# Patient Record
Sex: Male | Born: 1975 | Race: Black or African American | Hispanic: No | Marital: Single | Smoking: Current every day smoker
Health system: Southern US, Community
[De-identification: ages and names within clinical notes are randomized; demographics above are authoritative.]

---

## 1998-05-08 ENCOUNTER — Emergency Department (HOSPITAL_COMMUNITY): Admission: EM | Admit: 1998-05-08 | Discharge: 1998-05-08 | Payer: Self-pay | Admitting: Emergency Medicine

## 1998-05-08 ENCOUNTER — Encounter: Payer: Self-pay | Admitting: Emergency Medicine

## 2003-08-14 ENCOUNTER — Emergency Department (HOSPITAL_COMMUNITY): Admission: EM | Admit: 2003-08-14 | Discharge: 2003-08-14 | Payer: Self-pay | Admitting: Emergency Medicine

## 2013-03-10 ENCOUNTER — Emergency Department (HOSPITAL_COMMUNITY): Payer: Self-pay

## 2013-03-10 ENCOUNTER — Encounter (HOSPITAL_COMMUNITY): Payer: Self-pay | Admitting: *Deleted

## 2013-03-10 ENCOUNTER — Emergency Department (HOSPITAL_COMMUNITY)
Admission: EM | Admit: 2013-03-10 | Discharge: 2013-03-10 | Disposition: A | Discharge status: Home / Self Care | Payer: Self-pay | Attending: Emergency Medicine | Admitting: Emergency Medicine

## 2013-03-10 DIAGNOSIS — Z4889 Encounter for other specified surgical aftercare: Secondary | ICD-10-CM | POA: Insufficient documentation

## 2013-03-10 DIAGNOSIS — S91109A Unspecified open wound of unspecified toe(s) without damage to nail, initial encounter: Secondary | ICD-10-CM | POA: Insufficient documentation

## 2013-03-10 DIAGNOSIS — Y939 Activity, unspecified: Secondary | ICD-10-CM | POA: Insufficient documentation

## 2013-03-10 DIAGNOSIS — Y929 Unspecified place or not applicable: Secondary | ICD-10-CM | POA: Insufficient documentation

## 2013-03-10 DIAGNOSIS — X58XXXA Exposure to other specified factors, initial encounter: Secondary | ICD-10-CM | POA: Insufficient documentation

## 2013-03-10 DIAGNOSIS — T148XXD Other injury of unspecified body region, subsequent encounter: Secondary | ICD-10-CM

## 2013-03-10 LAB — GLUCOSE, CAPILLARY: Glucose-Capillary: 95 mg/dL (ref 70–99)

## 2013-03-10 MED ORDER — HYDROCODONE-ACETAMINOPHEN 5-325 MG PO TABS
2.0000 | ORAL_TABLET | Freq: Once | ORAL | Status: AC
  Administered 2013-03-10: 2 via ORAL
  Filled 2013-03-10: qty 2

## 2013-03-10 MED ORDER — HYDROCODONE-ACETAMINOPHEN 5-325 MG PO TABS: 1.0000 | ORAL_TABLET | Freq: Four times a day (QID) | ORAL | Status: DC | PRN

## 2013-03-10 NOTE — ED Notes (Addendum)
Pt reports ongoing right toe pain, states [place] had the toe lanced about 1.5 months ago and has had difficulty with toe since then. Pain 8/10. Pt ambulatory.

## 2013-03-10 NOTE — ED Provider Notes (Signed)
History    CSN: [id number] Arrival date & time 03/10/13  7425  First MD Initiated Contact with Patient 03/10/13 1000     Chief Complaint  Patient presents with  . Toe Pain   (Consider location/radiation/quality/duration/timing/severity/associated sxs/prior Treatment) HPI Comments: Patient presents today with a wound of the dorsal aspect of the right 5th toe.  He reports that he had an abscess incised and drained in that area approximately 6 weeks ago.  The wound has been present since that time.  He states that he has not noticed any changes in the wound.  No drainage from the wound.  No surrounding erythema or edema.  He is able to wiggle his toes.  Denies numbness or tingling.  He states that the wound is painful.  He has not tried any treatment prior to arrival.  He denies history of DM or HIV.  He states that he is otherwise healthy.  The history is provided by the patient.   History reviewed. No pertinent past medical history. No past surgical history on file. No family history on file. History  Substance Use Topics  . Smoking status: Not on file  . Smokeless tobacco: Not on file  . Alcohol Use: Not on file    Review of Systems  Skin: Positive for wound.    Allergies  Review of patient's allergies indicates no known allergies.  Home Medications   Current Outpatient Rx  Name  Route  Sig  Dispense  Refill  . ibuprofen (ADVIL,MOTRIN) 200 MG tablet   Oral   Take 400 mg by mouth every 6 (six) hours as needed for pain.          BP 162/88  Pulse 84  Temp(Src) 97.8 F (36.6 C) (Oral)  Resp 18  SpO2 99% Physical Exam  Nursing note and vitals reviewed. Constitutional: He appears well-developed and well-nourished. No distress.  Cardiovascular: Normal rate, regular rhythm, normal heart sounds and intact distal pulses.   Pulses:      Dorsalis pedis pulses are 2+ on the right side, and 2+ on the left side.  Pulmonary/Chest: Effort normal and breath sounds normal.   Musculoskeletal: Normal range of motion.  Patient able to wiggle all toes of the right foot.  Neurological: He is alert. Gait normal.  Sensation of all toes intact  Skin: Skin is warm and dry. He is not diaphoretic.  1 cm circular elevated wound on the dorsal aspect of the right 5th toe.  No drainage of the wound Good capillary refill of the right 5th toe No erythema, edema, or warmth surrounding the wound.  Psychiatric: He has a normal mood and affect.    ED Course  Procedures (including critical care time) Labs Reviewed  GLUCOSE, CAPILLARY   Dg Foot Complete Right  03/10/2013   *RADIOLOGY REPORT*  Clinical Data: 37 year old male with history of right foot infection 1 month ago.  Persistent fifth toe pain.  RIGHT FOOT COMPLETE - 3+ VIEW  Comparison: None.  Findings: Mild pes planus.  Calcaneus intact.  Joint spaces and alignment preserved. Bone mineralization is within normal limits. No osteolysis identified.  No subcutaneous gas.  No acute fracture or dislocation identified.  IMPRESSION: No acute osseous abnormality identified about the right foot.   Original Report Authenticated By: Erskine Speed, M.D.   No diagnosis found.  MDM  Patient presenting with wound to his right 5th toe that has been present for the past 6 weeks.  He states that the wound is  unchanged.  Xray negative.  No signs of infection at this time.  Patient denies history of DM and has a normal CBG in the ED.  Feel that patient is stable for discharge.  Patient given referral to Podiatry.  Pascal Lux [place], PA-C 03/11/13 1235

## 2013-03-10 NOTE — Progress Notes (Signed)
[facility] has seen patient and provided him with a list of primary care resources. °

## 2013-03-11 NOTE — ED Provider Notes (Signed)
Medical screening examination/treatment/procedure(s) were performed by non-physician practitioner and as supervising physician I was immediately available for consultation/collaboration.    Hanifa Antonetti R Marria Mathison, MD 03/11/13 1554 

## 2013-04-02 ENCOUNTER — Encounter (HOSPITAL_BASED_OUTPATIENT_CLINIC_OR_DEPARTMENT_OTHER): Payer: Self-pay | Attending: General Surgery

## 2013-04-02 DIAGNOSIS — I10 Essential (primary) hypertension: Secondary | ICD-10-CM | POA: Insufficient documentation

## 2013-04-02 DIAGNOSIS — L84 Corns and callosities: Secondary | ICD-10-CM | POA: Insufficient documentation

## 2013-04-03 NOTE — Progress Notes (Signed)
[facility]  NAME:  EDITH, LORD              ACCOUNT NO.:  [id number]  MEDICAL RECORD NO.:  [id number]      DATE OF BIRTH:  August 29, 1976  PHYSICIAN:  Ardath Sax, M.D.           VISIT DATE:                                  OFFICE VISIT   This is a previously healthy 37 year old African American male who comes to [place] with mirror images of each other of what looks to be callus on the lateral fifth toe, both of his feet.  I think it is just from poor fitting shoes and I advised him to use Dr. Margart Sickles foot pads and I even said to go to [employer] to have shoes made properly.  He is not a diabetic.  He takes no medicine, although he was hypertensive.  His blood pressure was 175/100, pulse 67, temperature 98.  He has no true ulcers or just callus formation or what almost looks like corns.  So, I advised him to get pressure off of these, get some proper fitting shoes and use some Dr. Margart Sickles corn pads and other diagnosis, I have told him he was hypertensive and to see his doctor.     Ardath Sax, M.D.     PP/MEDQ  D:  04/02/2013  T:  04/03/2013  Job:  [id number]

## 2014-06-29 ENCOUNTER — Emergency Department (HOSPITAL_COMMUNITY)
Admission: EM | Admit: 2014-06-29 | Discharge: 2014-06-29 | Disposition: A | Discharge status: Home / Self Care | Payer: Self-pay | Attending: Emergency Medicine | Admitting: Emergency Medicine

## 2014-06-29 ENCOUNTER — Emergency Department (HOSPITAL_COMMUNITY): Payer: Self-pay

## 2014-06-29 ENCOUNTER — Encounter (HOSPITAL_COMMUNITY): Payer: Self-pay | Admitting: Emergency Medicine

## 2014-06-29 DIAGNOSIS — Y9389 Activity, other specified: Secondary | ICD-10-CM | POA: Insufficient documentation

## 2014-06-29 DIAGNOSIS — S62317A Displaced fracture of base of fifth metacarpal bone. left hand, initial encounter for closed fracture: Secondary | ICD-10-CM | POA: Insufficient documentation

## 2014-06-29 DIAGNOSIS — IMO0001 Reserved for inherently not codable concepts without codable children: Secondary | ICD-10-CM

## 2014-06-29 DIAGNOSIS — R030 Elevated blood-pressure reading, without diagnosis of hypertension: Secondary | ICD-10-CM | POA: Insufficient documentation

## 2014-06-29 DIAGNOSIS — W228XXA Striking against or struck by other objects, initial encounter: Secondary | ICD-10-CM | POA: Insufficient documentation

## 2014-06-29 DIAGNOSIS — S62308A Unspecified fracture of other metacarpal bone, initial encounter for closed fracture: Secondary | ICD-10-CM

## 2014-06-29 DIAGNOSIS — Z720 Tobacco use: Secondary | ICD-10-CM | POA: Insufficient documentation

## 2014-06-29 DIAGNOSIS — Y9289 Other specified places as the place of occurrence of the external cause: Secondary | ICD-10-CM | POA: Insufficient documentation

## 2014-06-29 MED ORDER — HYDROCODONE-ACETAMINOPHEN 5-325 MG PO TABS: 1.0000 | ORAL_TABLET | ORAL | Status: DC | PRN

## 2014-06-29 MED ORDER — OXYCODONE-ACETAMINOPHEN 5-325 MG PO TABS
1.0000 | ORAL_TABLET | Freq: Once | ORAL | Status: AC
  Administered 2014-06-29: 1 via ORAL
  Filled 2014-06-29: qty 1

## 2014-06-29 MED ORDER — NAPROXEN 500 MG PO TABS: 500.0000 mg | ORAL_TABLET | Freq: Two times a day (BID) | ORAL | Status: AC

## 2014-06-29 NOTE — Discharge Instructions (Signed)
Naprosyn for pain. Norco for severe pain. Follow up with orthopedics specialist. Return if any issues. Watch your diet to help with blood pressure. Follow up with [facility] for recheck and further treatment.    Boxer's Fracture You have a break (fracture) of the fifth metacarpal bone. This is commonly called a boxer's fracture. This is the bone in the hand where the little finger attaches. The fracture is in the end of that bone, closest to the little finger. It is usually caused when you hit an object with a clenched fist. Often, the knuckle is pushed down by the impact. Sometimes, the fracture rotates out of position. A boxer's fracture will usually heal within 6 weeks, if it is treated properly and protected from re-injury. Surgery is sometimes needed. A cast, splint, or bulky hand dressing may be used to protect and immobilize a boxer's fracture. Do not remove this device or dressing until your caregiver approves. Keep your hand elevated, and apply ice packs for 15-20 minutes every 2 hours, for the first 2 days. Elevation and ice help reduce swelling and relieve pain. See your caregiver, or an orthopedic specialist, for follow-up care within the next 10 days. This is to make sure your fracture is healing properly. Document Released: 08/28/2005 Document Revised: 11/20/2011 Document Reviewed: 02/15/2007 [facility] Patient Information 2015 [place], [place]. This information is not intended to replace advice given to you by your health care provider. Make sure you discuss any questions you have with your health care provider.   DASH Eating Plan DASH stands for "Dietary Approaches to Stop Hypertension." The DASH eating plan is a healthy eating plan that has been shown to reduce high blood pressure (hypertension). Additional health benefits may include reducing the risk of type 2 diabetes mellitus, heart disease, and stroke. The DASH eating plan may also help with weight loss. WHAT DO I NEED TO  KNOW ABOUT THE DASH EATING PLAN? For the DASH eating plan, you will follow these general guidelines:  Choose foods with a percent daily value for sodium of less than 5% (as listed on the food label).  Use salt-free seasonings or herbs instead of table salt or sea salt.  Check with your health care provider or pharmacist before using salt substitutes.  Eat lower-sodium products, often labeled as "lower sodium" or "no salt added."  Eat fresh foods.  Eat more vegetables, fruits, and low-fat dairy products.  Choose whole grains. Look for the word "whole" as the first word in the ingredient list.  Choose fish and skinless chicken or [place]key more often than red meat. Limit fish, poultry, and meat to 6 oz (170 g) each day.  Limit sweets, desserts, sugars, and sugary drinks.  Choose heart-healthy fats.  Limit cheese to 1 oz (28 g) per day.  Eat more home-cooked food and less restaurant, buffet, and fast food.  Limit fried foods.  Cook foods using methods other than frying.  Limit canned vegetables. If you do use them, rinse them well to decrease the sodium.  When eating at a restaurant, ask that your food be prepared with less salt, or no salt if possible. WHAT FOODS CAN I EAT? Seek help from a dietitian for individual calorie needs. Grains Whole grain or whole wheat bread. Brown rice. Whole grain or whole wheat pasta. Quinoa, bulgur, and whole grain cereals. Low-sodium cereals. Corn or whole wheat flour tortillas. Whole grain cornbread. Whole grain crackers. Low-sodium crackers. Vegetables Fresh or frozen vegetables (raw, steamed, roasted, or grilled). Low-sodium or  reduced-sodium tomato and vegetable juices. Low-sodium or reduced-sodium tomato sauce and paste. Low-sodium or reduced-sodium canned vegetables.  Fruits All fresh, canned (in natural juice), or frozen fruits. Meat and Other Protein Products Ground beef (85% or leaner), grass-fed beef, or beef trimmed of fat. Skinless  chicken or [place]key. Ground chicken or [place]key. Pork trimmed of fat. All fish and seafood. Eggs. Dried beans, peas, or lentils. Unsalted nuts and seeds. Unsalted canned beans. Dairy Low-fat dairy products, such as skim or 1% milk, 2% or reduced-fat cheeses, low-fat ricotta or cottage cheese, or plain low-fat yogurt. Low-sodium or reduced-sodium cheeses. Fats and Oils Tub margarines without trans fats. Light or reduced-fat mayonnaise and salad dressings (reduced sodium). Avocado. Safflower, olive, or canola oils. Natural peanut or almond butter. Other Unsalted popcorn and pretzels. The items listed above may not be a complete list of recommended foods or beverages. Contact your dietitian for more options. WHAT FOODS ARE NOT RECOMMENDED? Grains White bread. White pasta. White rice. Refined cornbread. Bagels and croissants. Crackers that contain trans fat. Vegetables Creamed or fried vegetables. Vegetables in a cheese sauce. Regular canned vegetables. Regular canned tomato sauce and paste. Regular tomato and vegetable juices. Fruits Dried fruits. Canned fruit in light or heavy syrup. Fruit juice. Meat and Other Protein Products Fatty cuts of meat. Ribs, chicken wings, bacon, sausage, bologna, salami, chitterlings, fatback, hot dogs, bratwurst, and packaged luncheon meats. Salted nuts and seeds. Canned beans with salt. Dairy Whole or 2% milk, cream, half-and-half, and cream cheese. Whole-fat or sweetened yogurt. Full-fat cheeses or blue cheese. Nondairy creamers and whipped toppings. Processed cheese, cheese spreads, or cheese curds. Condiments Onion and garlic salt, seasoned salt, table salt, and sea salt. Canned and packaged gravies. Worcestershire sauce. Tartar sauce. Barbecue sauce. Teriyaki sauce. Soy sauce, including reduced sodium. Steak sauce. Fish sauce. Oyster sauce. Cocktail sauce. Horseradish. Ketchup and mustard. Meat flavorings and tenderizers. Bouillon cubes. Hot sauce. Tabasco sauce.  Marinades. Taco seasonings. Relishes. Fats and Oils Butter, stick margarine, lard, shortening, ghee, and bacon fat. Coconut, palm kernel, or palm oils. Regular salad dressings. Other Pickles and olives. Salted popcorn and pretzels. The items listed above may not be a complete list of foods and beverages to avoid. Contact your dietitian for more information. WHERE CAN I FIND MORE INFORMATION? National Heart, Lung, and Blood Institute: CableProm[employer].itwww.nhlbi.nih.gov/health/health-topics/topics/dash/ Document Released: 08/17/2011 Document Revised: 01/12/2014 Document Reviewed: 07/02/2013 [facility]ExitCare Patient Information 2015 [place]ExitCare, [place]LLC. This information is not intended to replace advice given to you by your health care provider. Make sure you discuss any questions you have with your health care provider.

## 2014-06-29 NOTE — ED Notes (Signed)
Pt escorted to discharge window. Pt verbalized understanding discharge instructions. In no acute distress.  

## 2014-06-29 NOTE — ED Provider Notes (Signed)
CSN: [id number]     Arrival date & time 06/29/14  1050 History   First MD Initiated Contact with Patient 06/29/14 1102     Chief Complaint  Patient presents with  . Hand Injury     (Consider location/radiation/quality/duration/timing/severity/associated sxs/prior Treatment) HPI Hilbert OdorMarcus J Bussie is a 38 y.o. male who presents to emergency department complaining of left hand injury. Patient states he was punching I punching bag, and states he accidentally hit a metal part of the top. States this happened 3 days ago. He complaining of swelling and pain to the ulnar aspect of the hand. States unable to flex his fourth and fifth fingers. No other injuries. He has applied ice and taking ibuprofen with no relief. No prior hand injuries   History reviewed. No pertinent past medical history. History reviewed. No pertinent past surgical history. History reviewed. No pertinent family history. History  Substance Use Topics  . Smoking status: Current Every Day Smoker -- 1.50 packs/day for 25 years    Types: Cigarettes  . Smokeless tobacco: Not on file  . Alcohol Use: Yes    Review of Systems  Constitutional: Negative for fever and chills.  Respiratory: Negative for cough, chest tightness and shortness of breath.   Cardiovascular: Negative for chest pain, palpitations and leg swelling.  Musculoskeletal: Positive for arthralgias and joint swelling. Negative for myalgias, neck pain and neck stiffness.  Skin: Negative for rash.  Allergic/Immunologic: Negative for immunocompromised state.  Neurological: Negative for dizziness, weakness, light-headedness, numbness and headaches.  All other systems reviewed and are negative.     Allergies  Review of patient's allergies indicates no known allergies.  Home Medications   Prior to Admission medications   Medication Sig Start Date End Date Taking? Authorizing Provider  HYDROcodone-acetaminophen (NORCO/VICODIN) 5-325 MG per tablet Take 1-2 tablets  by mouth every 6 (six) hours as needed for pain. 03/10/13   Heather Laisure, PA-C  ibuprofen (ADVIL,MOTRIN) 200 MG tablet Take 400 mg by mouth every 6 (six) hours as needed for pain.    Historical Provider, MD   BP 178/126  Pulse 75  Temp(Src) 99 F (37.2 C) (Oral)  Resp 16  SpO2 98% Physical Exam  Nursing note and vitals reviewed. Constitutional: He appears well-developed and well-nourished. No distress.  HENT:  Head: Normocephalic and atraumatic.  Eyes: Conjunctivae are normal.  Neck: Neck supple.  Cardiovascular: Normal rate, regular rhythm and normal heart sounds.   Pulmonary/Chest: Effort normal. No respiratory distress. He has no wheezes. He has no rales.  Musculoskeletal:  Swelling noted to the left hand, specifically over fourth and fifth metacarpals. Tender to palpation. His wrist exam is normal with no tenderness, full range of motion. Patient unable to flex or extend fourth and fifth digits at MCP joints. Cap refill <2sec distally   Neurological: He is alert.  Skin: Skin is warm and dry.    ED Course  Procedures (including critical care time) Labs Review Labs Reviewed - No data to display  Imaging Review Dg Hand Complete Left  06/29/2014   CLINICAL DATA:  Initial encounter after punching injury with pain and swelling to the medial aspect of the left hand.  EXAM: LEFT HAND - COMPLETE 3+ VIEW  COMPARISON:  None.  FINDINGS: Three view exam of the left hand shows a comminuted fracture of the fifth metacarpal neck. No definite extension to the articular surface. Mild apex posterior angulation is evident. No other fracture involving the left hand. Overlying soft tissue swelling is evident.  IMPRESSION:  Comminuted fracture of the distal fifth metacarpal.   Electronically Signed   By: Kennith CenterEric  Mansell M.D.   On: 06/29/2014 11:39     EKG Interpretation None      MDM   Final diagnoses:  Closed fracture of 5th metacarpal, initial encounter  Elevated blood pressure     Patient's with left hand boxer's fracture, 3 days ago. Discussed with Dr. Melvyn Novasrtmann recommended splinting and followup in the office. Ulnar gutter splint applied by orthostatic. We'll discharge home with pain medications and followup. Patient's blood pressure was noted to be very high in emergency department. He is asymptomatic for his elevated blood pressure, specifically there is no headache, chest pain, shortness of breath, extremity swelling, dizziness, weakness. His blood pressure could also be slightly higher today due to pain. He does not have a primary care provider, and has not had a physical in multiple years. Will discharge home with outpatient followup, [facility] have his blood pressure rechecked in one week. Return precautions discussed.  Filed Vitals:   06/29/14 1055 06/29/14 1234  BP: 178/126 161/112  Pulse: 75 76  Temp: 99 F (37.2 C) 98.3 F (36.8 C)  TempSrc: Oral Oral  Resp: 16 16  SpO2: 98% 97%      Deeksha Cotrell A Meghen Akopyan, PA-C 06/29/14 1337

## 2014-06-29 NOTE — ED Notes (Addendum)
Pt reports on Friday night he was at a bar with a punching machine, pt punched but hit metal top part of the machine with left hand. Pt has noticeable swelling to left side of hand and pinkie finger area. Pain 7/10. Radial pulse present. Pt able to move all fingers, but has difficulty fully bending pinkie finger. Pt denies hx of HTN.

## 2014-06-29 NOTE — ED Provider Notes (Signed)
Medical screening examination/treatment/procedure(s) were performed by non-physician practitioner and as supervising physician I was immediately available for consultation/collaboration.   EKG Interpretation None       Ethelda ChickMartha K Linker, MD 06/29/14 1341

## 2014-07-30 ENCOUNTER — Emergency Department (HOSPITAL_COMMUNITY)
Admission: EM | Admit: 2014-07-30 | Discharge: 2014-07-30 | Disposition: A | Discharge status: Home / Self Care | Payer: Self-pay | Attending: Emergency Medicine | Admitting: Emergency Medicine

## 2014-07-30 ENCOUNTER — Emergency Department (HOSPITAL_COMMUNITY): Payer: Self-pay

## 2014-07-30 DIAGNOSIS — Y998 Other external cause status: Secondary | ICD-10-CM | POA: Insufficient documentation

## 2014-07-30 DIAGNOSIS — S62339A Displaced fracture of neck of unspecified metacarpal bone, initial encounter for closed fracture: Secondary | ICD-10-CM

## 2014-07-30 DIAGNOSIS — L03119 Cellulitis of unspecified part of limb: Secondary | ICD-10-CM

## 2014-07-30 DIAGNOSIS — Z791 Long term (current) use of non-steroidal anti-inflammatories (NSAID): Secondary | ICD-10-CM | POA: Insufficient documentation

## 2014-07-30 DIAGNOSIS — L03115 Cellulitis of right lower limb: Secondary | ICD-10-CM | POA: Insufficient documentation

## 2014-07-30 DIAGNOSIS — Y9389 Activity, other specified: Secondary | ICD-10-CM | POA: Insufficient documentation

## 2014-07-30 DIAGNOSIS — X58XXXA Exposure to other specified factors, initial encounter: Secondary | ICD-10-CM | POA: Insufficient documentation

## 2014-07-30 DIAGNOSIS — S62307A Unspecified fracture of fifth metacarpal bone, left hand, initial encounter for closed fracture: Secondary | ICD-10-CM

## 2014-07-30 DIAGNOSIS — B353 Tinea pedis: Secondary | ICD-10-CM | POA: Insufficient documentation

## 2014-07-30 DIAGNOSIS — S62336A Displaced fracture of neck of fifth metacarpal bone, right hand, initial encounter for closed fracture: Secondary | ICD-10-CM | POA: Insufficient documentation

## 2014-07-30 DIAGNOSIS — Y929 Unspecified place or not applicable: Secondary | ICD-10-CM | POA: Insufficient documentation

## 2014-07-30 DIAGNOSIS — Z720 Tobacco use: Secondary | ICD-10-CM | POA: Insufficient documentation

## 2014-07-30 MED ORDER — HYDROCODONE-ACETAMINOPHEN 5-325 MG PO TABS: 2.0000 | ORAL_TABLET | ORAL | Status: AC | PRN

## 2014-07-30 MED ORDER — TERBINAFINE HCL 1 % EX CREA: 1.0000 "application " | TOPICAL_CREAM | Freq: Two times a day (BID) | CUTANEOUS | Status: AC

## 2014-07-30 MED ORDER — IBUPROFEN 800 MG PO TABS
800.0000 mg | ORAL_TABLET | Freq: Once | ORAL | Status: AC
  Administered 2014-07-30: 800 mg via ORAL
  Filled 2014-07-30: qty 1

## 2014-07-30 MED ORDER — IBUPROFEN 800 MG PO TABS: 800.0000 mg | ORAL_TABLET | Freq: Three times a day (TID) | ORAL | Status: AC | PRN

## 2014-07-30 MED ORDER — CEPHALEXIN 500 MG PO CAPS
500.0000 mg | ORAL_CAPSULE | Freq: Once | ORAL | Status: AC
  Administered 2014-07-30: 500 mg via ORAL
  Filled 2014-07-30: qty 1

## 2014-07-30 MED ORDER — CEPHALEXIN 500 MG PO CAPS: 500.0000 mg | ORAL_CAPSULE | Freq: Four times a day (QID) | ORAL | Status: AC

## 2014-07-30 NOTE — ED Provider Notes (Signed)
CSN: [id number]     Arrival date & time 07/30/14  1028 History   First MD Initiated Contact with Patient 07/30/14 1109     Chief Complaint  Patient presents with  . Toe Injury    left hand cast removal     (Consider location/radiation/quality/duration/timing/severity/associated sxs/prior Treatment) HPI  Pt presents with two complaints.  He p/w pain in his right foot between the 4th and 5th toes.  Has had pain ongoing for 1 week, worse with standing or palpation, but did not look at it until last night when he noticed a "hole" in his foot.  Denies trying any treatment.  Denies weakness or numbness of the foot.  Denies fevers.  Denies leg pain or swelling.  He is not diabetic and has a normal immune system to his knowledge.  He was seen 06/29/14 and dx with boxer's fracture of the left hand.  He was not able to follow up with hand surgery Melvyn Novas(Ortmann) because they required him to pay over $200 to be seen.  He has continued to wear the splint but is currently requesting that it be taken off.  He denies having any pain in it since 3 days after the splint was placed.  NO other concerns regarding his hand.    No past medical history on file. No past surgical history on file. No family history on file. History  Substance Use Topics  . Smoking status: Current Every Day Smoker -- 1.50 packs/day for 25 years    Types: Cigarettes  . Smokeless tobacco: Not on file  . Alcohol Use: Yes    Review of Systems  Constitutional: Negative for fever and chills.  Musculoskeletal: Negative for myalgias, joint swelling and arthralgias.  Skin: Positive for wound. Negative for color change, pallor and rash.  Allergic/Immunologic: Negative for immunocompromised state.  Neurological: Negative for weakness and numbness.  Hematological: Does not bruise/bleed easily.  Psychiatric/Behavioral: Negative for self-injury.      Allergies  Review of patient's allergies indicates no known allergies.  Home  Medications   Prior to Admission medications   Medication Sig Start Date End Date Taking? Authorizing Provider  HYDROcodone-acetaminophen (NORCO/VICODIN) 5-325 MG per tablet Take 1-2 tablets by mouth every 6 (six) hours as needed for pain. 03/10/13   Santiago GladHeather Laisure, PA-C  HYDROcodone-acetaminophen (NORCO/VICODIN) 5-325 MG per tablet Take 1-2 tablets by mouth every 4 (four) hours as needed for moderate pain or severe pain. 06/29/14   Tatyana A Kirichenko, PA-C  ibuprofen (ADVIL,MOTRIN) 200 MG tablet Take 400 mg by mouth every 6 (six) hours as needed for pain.    Historical Provider, MD  naproxen (NAPROSYN) 500 MG tablet Take 1 tablet (500 mg total) by mouth 2 (two) times daily. 06/29/14   Tatyana A Kirichenko, PA-C   BP 144/98 mmHg  Pulse 70  Temp(Src) 98.9 F (37.2 C) (Oral)  Resp 16  SpO2 98% Physical Exam  Constitutional: He appears well-developed and well-nourished. No distress.  HENT:  Head: Normocephalic and atraumatic.  Neck: Neck supple.  Pulmonary/Chest: Effort normal.  Musculoskeletal:  Right foot with macerated skin between 4th and 5th toes with small opening.  Dorsal aspect of foot at this space with mild erythema, edema, tenderness to palpation.  No induration or fluctuance.  No discharge.  Full AROM of toes, sensation intact.    Left hand with short arm splint in place, soiled ace wrap wrapped haphazardly over the splint.    Neurological: He is alert.  Skin: He is not diaphoretic.  Nursing note and vitals reviewed.   ED Course  Procedures (including critical care time) Labs Review Labs Reviewed - No data to display  Imaging Review Dg Hand Complete Left  07/30/2014   CLINICAL DATA:  Followup boxer fractures to  left hand x 1 month  EXAM: LEFT HAND - COMPLETE 3+ VIEW  COMPARISON:  None.  FINDINGS: Fine detail obscured by splint material. There is fracture of the distal fifth metacarpal with ventral angulation. Mild sclerosis along the fracture line.  IMPRESSION:  Fracture of the distal fifth metacarpal with mild sclerosis along the fracture plane.   Electronically Signed   By: Genevive BiStewart  Edmunds M.D.   On: 07/30/2014 12:13     EKG Interpretation None      MDM   Final diagnoses:  Boxer's fracture  Fracture of fifth metacarpal bone of left hand, closed, initial encounter  Tinea pedis of right foot  Cellulitis of foot    Afebrile, nontoxic, immunocompetent patient with request to remove left short arm splint because he feels completely asymptomatic from boxers fracture and has not been able to follow up with hand surgery.  Xray shows persistent fracture.  Splint replaced by ortho tech. Right foot with tinea pedis with possible bacterial superinfection.  Will treat with lamisil and cover with keflex.   D/C home with norco, ibuprofen, lamisil, keflex.  Pt given resources for primary care providers in the area.  Hand surgery follow up.   Discussed result, findings, treatment, and follow up  with patient.  Pt given return precautions.  Pt verbalizes understanding and agrees with plan.        Trixie Dredgemily Zakyla Tonche, PA-C 07/30/14 1401  Derwood KaplanAnkit Nanavati, MD 07/30/14 2158

## 2014-07-30 NOTE — Discharge Instructions (Signed)
Read the information below.  Use the prescribed medication as directed.  Please discuss all new medications with your pharmacist.  Do not take additional tylenol while taking the prescribed pain medication to avoid overdose.  You may return to the Emergency Department at any time for worsening condition or any new symptoms that concern you.  If you develop increased redness, swelling, pus draining from the wound, or fevers greater than 100.4, return to the ER immediately for a recheck.  If you develop uncontrolled pain, weakness or numbness of the extremity, severe discoloration of the skin, or you are unable to fingers, return to the ER for a recheck.      Athlete's Foot  Athlete's foot is a skin infection caused by a fungus. Athlete's foot is often seen between or under the toes. It can also be seen on the bottom of the foot. Athlete's foot can spread to other people by sharing towels or shower stalls. HOME CARE  Only take medicines as told by your doctor. Do not use steroid creams.  Wash your feet daily. Dry your feet well, especially between the toes.  Change your socks every day. Wear cotton or wool socks.  Change your socks 2 to 3 times a day in hot weather.  Wear sandals or canvas tennis shoes with good airflow.  If you have blisters, soak your feet in a solution as told by your doctor. Do this for 20 to 30 minutes, 2 times a day. Dry your feet well after you soak them.  Do not share towels.  Wear sandals when you use shared locker rooms or showers. GET HELP RIGHT AWAY IF:   You have a fever.  Your foot is puffy (swollen), sore, warm, or red.  You are not getting better after 7 days of treatment.  You still have athlete's foot after 30 days.  You have problems caused by your medicine. MAKE SURE YOU:   Understand these instructions.  Will watch your condition.  Will get help right away if you are not doing well or get worse. Document Released: 02/14/2008 Document  Revised: 11/20/2011 Document Reviewed: 06/16/2011 [facility] Patient Information 2015 [place], [place]. This information is not intended to replace advice given to you by your health care provider. Make sure you discuss any questions you have with your health care provider.  Cellulitis Cellulitis is an infection of the skin and the tissue under the skin. The infected area is usually red and tender. This happens most often in the arms and lower legs. HOME CARE   Take your antibiotic medicine as told. Finish the medicine even if you start to feel better.  Keep the infected arm or leg raised (elevated).  Put a warm cloth on the area up to 4 times per day.  Only take medicines as told by your doctor.  Keep all doctor visits as told. GET HELP IF:  You see red streaks on the skin coming from the infected area.  Your red area gets bigger or turns a dark color.  Your bone or joint under the infected area is painful after the skin heals.  Your infection comes back in the same area or different area.  You have a puffy (swollen) bump in the infected area.  You have new symptoms.  You have a fever. GET HELP RIGHT AWAY IF:   You feel very sleepy.  You throw up (vomit) or have watery poop (diarrhea).  You feel sick and have muscle aches and pains. MAKE SURE YOU:  Understand these instructions.  Will watch your condition.  Will get help right away if you are not doing well or get worse. Document Released: 02/14/2008 Document Revised: 01/12/2014 Document Reviewed: 11/13/2011 [facility] Patient Information 2015 [place], [place]. This information is not intended to replace advice given to you by your health care provider. Make sure you discuss any questions you have with your health care provider.  Boxer's Fracture Boxer's fracture is a broken bone (fracture) of the fourth or fifth metacarpal (ring or pinky finger). The metacarpal bones connect the wrist to the fingers and make up the arch  of the hand. Boxer's fracture occurs toward the body (proximal) from the first knuckle. This injury is known as a boxer's fracture, because it often occurs from hitting an object with a closed fist. SYMPTOMS   Severe pain at the time of injury.  Pain and swelling around the first knuckle on the fourth or fifth finger.  Bruising (contusion) in the area within 48 hours of injury.  Visible deformity, such as a pushed down knuckle. This can occur if the fracture is complete, and the bone fragments separate enough to distort normal body shape.  Numbness or paralysis from swelling in the hand, causing pressure on the blood vessels or nerves (uncommon). CAUSES   Direct injury (trauma), such as a striking blow with the fist.  Indirect stress to the hand, such as twisting or violent muscle contraction (uncommon). RISK INCREASES WITH:  Punching an object with an unprotected knuckle.  Contact sports (football, rugby).  Sports that require hitting (boxing, martial arts).  History of bone or joint disease (osteoporosis). PREVENTION  Maintain physical fitness:  Muscle strength and flexibility.  Endurance.  Cardiovascular fitness.  For participation in contact sports, wear proper protective equipment for the hand and make sure it fits properly.  Learn and use proper technique when hitting or punching. PROGNOSIS  When proper treatment is given, to ensure normal alignment of the bones, healing can usually be expected in 4 to 6 weeks. Occasionally, surgery is necessary.  RELATED COMPLICATIONS   Bone does not heal back together (nonunion).  Bone heals together in an improper position (malunion), causing twisting of the finger when making a fist.  Chronic pain, stiffness, or swelling of the hand.  Excessive bleeding in the hand, causing pressure and injury to nerves and blood vessels (rare).  Stopping of normal hand growth in children.  Infection of the wound, if skin is broken over  the fracture (open fracture), or at the incision site if surgery is performed.  Shortening of injured bones.  Bony bump (prominence) in the palm or loss of shape of the knuckles.  Pain and weakness when gripping.  Arthritis of the affected joint, if the fracture goes into the joint, after repeated injury, or after delayed treatment.  Scarring around the knuckle, and limited motion. TREATMENT  Treatment varies, depending on the injury. The place in the hand where the injury occurs has a great deal of motion, which allows the hand to move properly. If the fracture is not aligned properly, this function may be decreased. If the bone ends are in proper alignment, treatment first involves ice and elevation of the injured hand, at or above heart level, to reduce inflammation. Pain medicines help to relieve pain. Treatment also involves restraint by splinting, bandaging, casting, or bracing for 4 or more weeks.  If the fracture is out of alignment (displaced), or it involves the joint, surgery is usually recommended. Surgery typically involves cutting  through the skin to place removable pins, screws, and sometimes plates over the fracture. After surgery, the bone and joint are restrained for 4 or more weeks. After restraint (with or without surgery), stretching and strengthening exercises are needed to regain proper strength and function of the joint. Exercises may be done at home or with the assistance of a therapist. Depending on the sport and position played, a brace or splint may be recommended when first returning to sports.  MEDICATION   If pain medication is necessary, nonsteroidal anti-inflammatory medications, such as aspirin and ibuprofen, or other minor pain relievers, such as acetaminophen, are often recommended.  Do not take pain medication for 7 days before surgery.  Prescription pain relievers may be necessary. Use only as directed and only as much as you need. COLD THERAPY Cold  treatment (icing) relieves pain and reduces inflammation. Cold treatment should be applied for 10 to 15 minutes every 2 to 3 hours for inflammation and pain, and immediately after any activity that aggravates your symptoms. Use ice packs or an ice massage. SEEK MEDICAL CARE IF:   Pain, tenderness, or swelling gets worse, despite treatment.  You experience pain, numbness, or coldness in the hand.  Blue, gray, or dark color appears in the fingernails.  You develop signs of infection, after surgery (fever, increased pain, swelling, redness, drainage of fluids, or bleeding in the surgical area).  You feel you have reinjured the hand.  New, unexplained symptoms develop. (Drugs used in treatment may produce side effects.) Document Released: 08/28/2005 Document Revised: 11/20/2011 Document Reviewed: 12/10/2008 [facility] Patient Information 2015 [place], [place]. This information is not intended to replace advice given to you by your health care provider. Make sure you discuss any questions you have with your health care provider.

## 2014-07-30 NOTE — Progress Notes (Signed)
[facility] Stacy,  ° °Provided pt with a list of primary care resources and a [facility] [employer] Card application to help patient establish primary care.  °

## 2014-07-30 NOTE — ED Notes (Signed)
Pt report no pain in l/hand initially, pain 5/10 after cast was replaced

## 2014-07-30 NOTE — ED Notes (Signed)
Pt to edfor left hand cast removal , pt unable to see orthopedic as he was referred due to finances.  pt co also of right pinky toe pain.

## 2014-12-11 DEATH — deceased

## 2015-08-30 IMAGING — CR DG HAND COMPLETE 3+V*L*
3 series · 3 of 3 positions shown · non-contrast
Comparison: None.

CLINICAL DATA: Followup boxer fractures to  left hand x 1 month

EXAM:
LEFT HAND - COMPLETE 3+ VIEW

[x hand pa left]
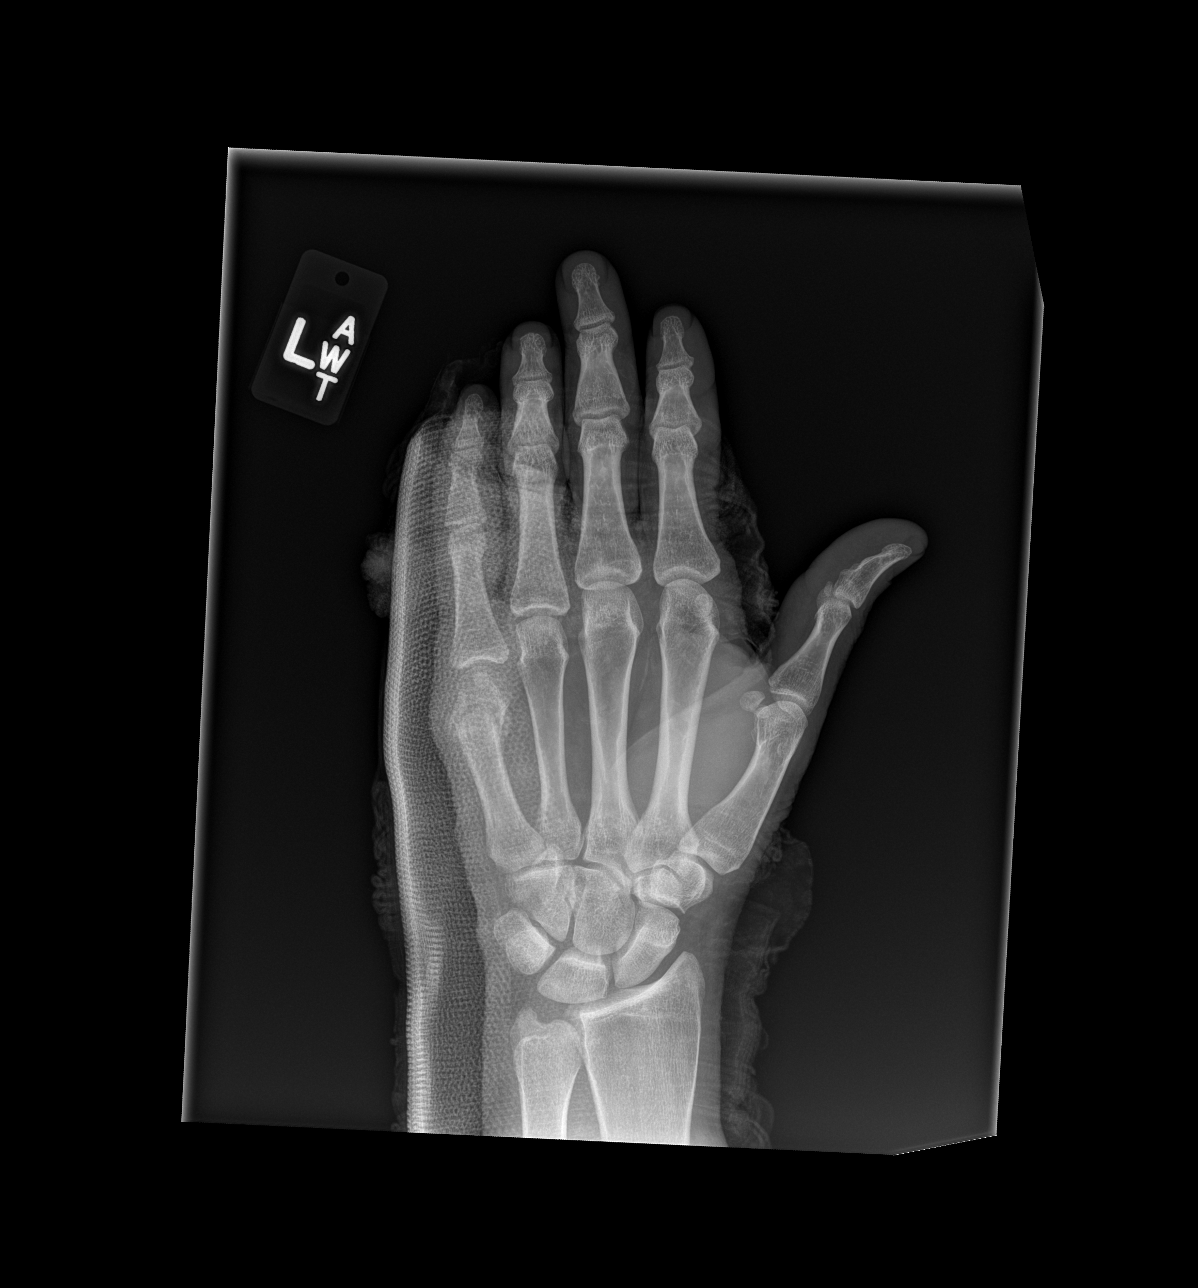

[x hand obl left]
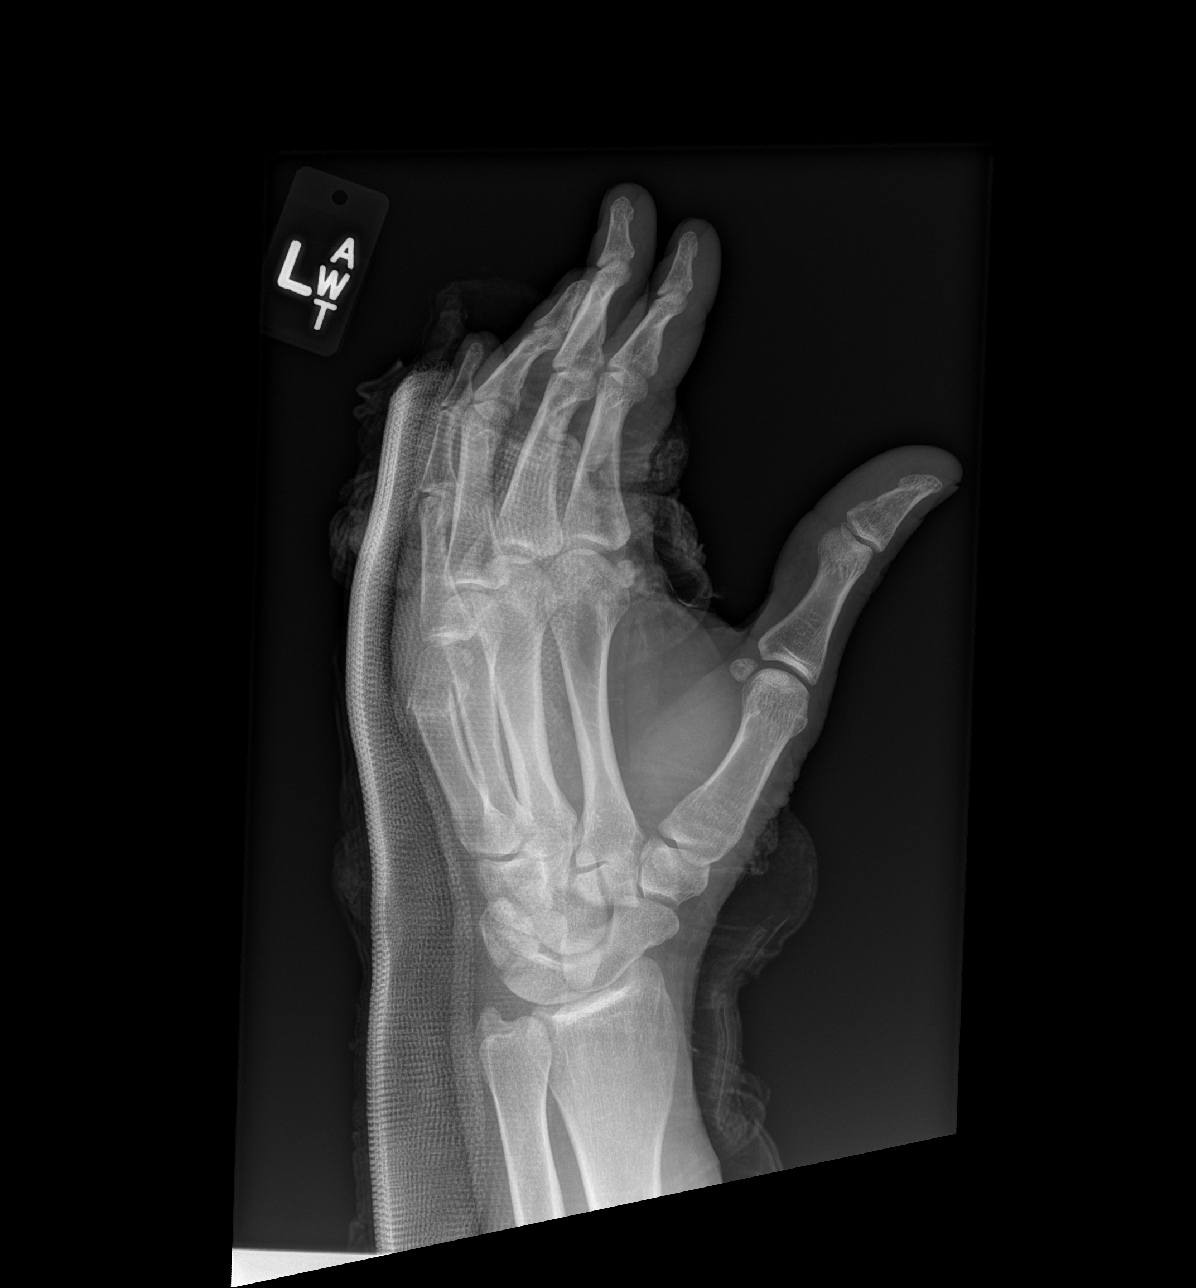

[x hand lat left]
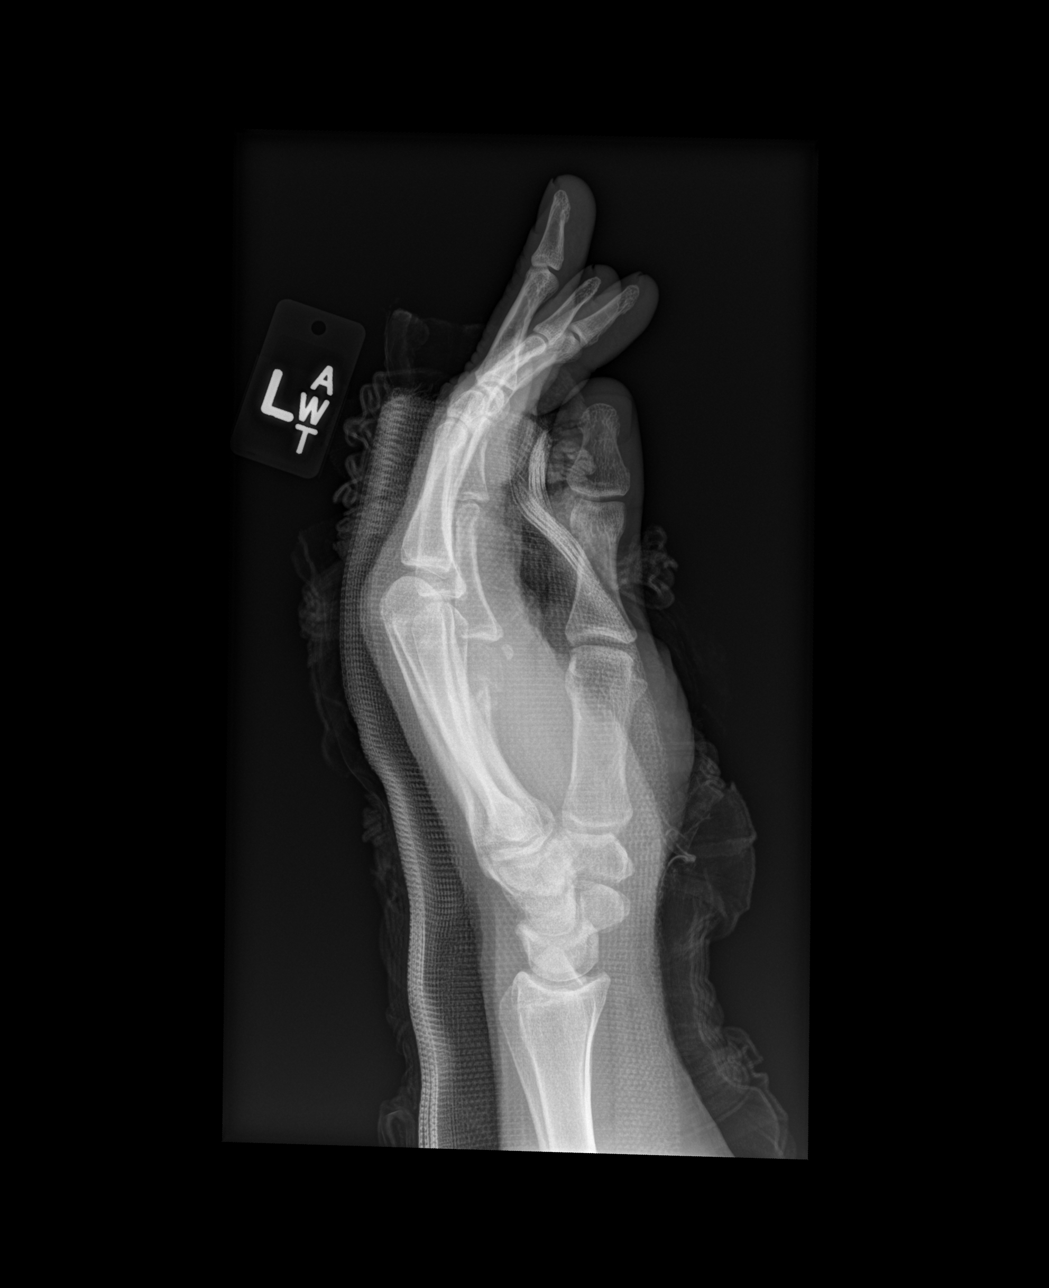

[3 of 3 positions shown; findings below may reference images not displayed]

FINDINGS: Fine detail obscured by splint material. There is fracture of the
distal fifth metacarpal with ventral angulation. Mild sclerosis
along the fracture line.
IMPRESSION: Fracture of the distal fifth metacarpal with mild sclerosis along
the fracture plane.
